# Patient Record
Sex: Female | Born: 1990 | Race: White | Hispanic: No | Marital: Married
Health system: Southern US, Community
[De-identification: ages and names within clinical notes are randomized; demographics above are authoritative.]

---

## 2011-12-15 ENCOUNTER — Emergency Department: Payer: Self-pay | Admitting: Emergency Medicine

## 2011-12-27 ENCOUNTER — Inpatient Hospital Stay: Payer: Self-pay | Admitting: Internal Medicine

## 2011-12-27 LAB — PREGNANCY, URINE: Pregnancy Test, Urine: NEGATIVE m[IU]/mL

## 2011-12-27 LAB — CBC WITH DIFFERENTIAL/PLATELET
Basophil %: 0.2 %
Eosinophil #: 0.2 10*3/uL (ref 0.0–0.7)
Eosinophil %: 2 %
HGB: 12.2 g/dL (ref 12.0–16.0)
Lymphocyte %: 17.7 %
MCHC: 32 g/dL (ref 32.0–36.0)
Neutrophil #: 7.3 10*3/uL — ABNORMAL HIGH (ref 1.4–6.5)
Neutrophil %: 71.8 %
Platelet: 354 10*3/uL (ref 150–440)
RBC: 4.69 10*6/uL (ref 3.80–5.20)
WBC: 10.1 10*3/uL (ref 3.6–11.0)

## 2011-12-27 LAB — URINALYSIS, COMPLETE
Blood: NEGATIVE
Glucose,UR: NEGATIVE mg/dL (ref 0–75)
Ketone: NEGATIVE
Leukocyte Esterase: NEGATIVE
Ph: 6 (ref 4.5–8.0)
Protein: NEGATIVE
RBC,UR: 1 /HPF (ref 0–5)
Specific Gravity: 1.017 (ref 1.003–1.030)
Squamous Epithelial: 23

## 2011-12-27 LAB — COMPREHENSIVE METABOLIC PANEL
Alkaline Phosphatase: 62 U/L — ABNORMAL LOW (ref 82–169)
Anion Gap: 13 (ref 7–16)
Bilirubin,Total: 0.2 mg/dL (ref 0.2–1.0)
Calcium, Total: 8.8 mg/dL — ABNORMAL LOW (ref 9.0–10.7)
Creatinine: 0.79 mg/dL (ref 0.60–1.30)
EGFR (African American): >60
EGFR (Non-African Amer.): >60
Glucose: 93 mg/dL (ref 65–99)
Osmolality: 289 (ref 275–301)
Potassium: 4.2 mmol/L (ref 3.5–5.1)
SGOT(AST): 29 U/L — ABNORMAL HIGH (ref 0–26)
Sodium: 145 mmol/L (ref 136–145)
Total Protein: 7.2 g/dL (ref 6.4–8.6)

## 2011-12-28 LAB — COMPREHENSIVE METABOLIC PANEL
Albumin: 2.9 g/dL — ABNORMAL LOW (ref 3.8–5.6)
Anion Gap: 7 (ref 7–16)
BUN: 10 mg/dL (ref 7–18)
Bilirubin,Total: 0.3 mg/dL (ref 0.2–1.0)
Chloride: 104 mmol/L (ref 98–107)
Creatinine: 0.66 mg/dL (ref 0.60–1.30)
EGFR (African American): >60
Osmolality: 282 (ref 275–301)
Potassium: 4 mmol/L (ref 3.5–5.1)
Total Protein: 6.8 g/dL (ref 6.4–8.6)

## 2011-12-28 LAB — CBC WITH DIFFERENTIAL/PLATELET
Basophil #: 0 10*3/uL (ref 0.0–0.1)
Basophil %: 0.3 %
Eosinophil #: 0.2 10*3/uL (ref 0.0–0.7)
Eosinophil %: 1.9 %
HCT: 37.9 % (ref 35.0–47.0)
HGB: 12 g/dL (ref 12.0–16.0)
Lymphocyte %: 19.7 %
MCHC: 31.8 g/dL — ABNORMAL LOW (ref 32.0–36.0)
MCV: 82 fL (ref 80–100)
Monocyte %: 7.2 %
Neutrophil #: 6.6 10*3/uL — ABNORMAL HIGH (ref 1.4–6.5)
Neutrophil %: 70.9 %
RBC: 4.63 10*6/uL (ref 3.80–5.20)
RDW: 16.8 % — ABNORMAL HIGH (ref 11.5–14.5)
WBC: 9.3 10*3/uL (ref 3.6–11.0)

## 2011-12-28 LAB — HEMOGLOBIN A1C: Hemoglobin A1C: 6.9 % — ABNORMAL HIGH (ref 4.2–6.3)

## 2011-12-28 LAB — LIPID PANEL
Cholesterol: 105 mg/dL (ref 0–200)
Ldl Cholesterol, Calc: 53 mg/dL (ref 0–100)

## 2011-12-28 LAB — VANCOMYCIN, TROUGH: Vancomycin, Trough: 10 ug/mL (ref 10–20)

## 2012-01-01 LAB — CULTURE, BLOOD (SINGLE)

## 2012-06-10 DEATH — deceased

## 2013-11-17 IMAGING — US US SOFT TISSUE EXCLUDE HEAD/NECK
1 series · 17 of 22 positions shown · non-contrast
Comparison: none

REASON FOR EXAM: abdominal wall cellulitis with suprapubic induration
concern for possible abscess
COMMENTS:

PROCEDURE:     US  - US SOFT TISSUE, NOT NECK /  HEAD  - December 27, 2011  [DATE]
RESULT:
TECHNIQUE: Real-time B-mode scan of the abdominal wall was obtained.

[Series 1: us soft tissue exclude head/neck · 17 of 22 slices shown]
[im 1/22]
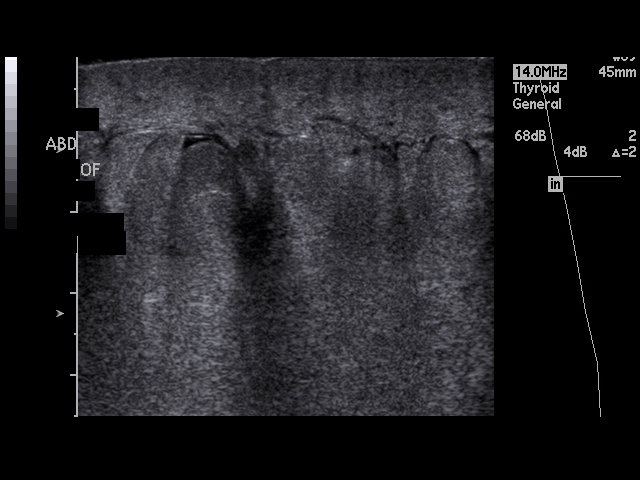
[im 2/22]
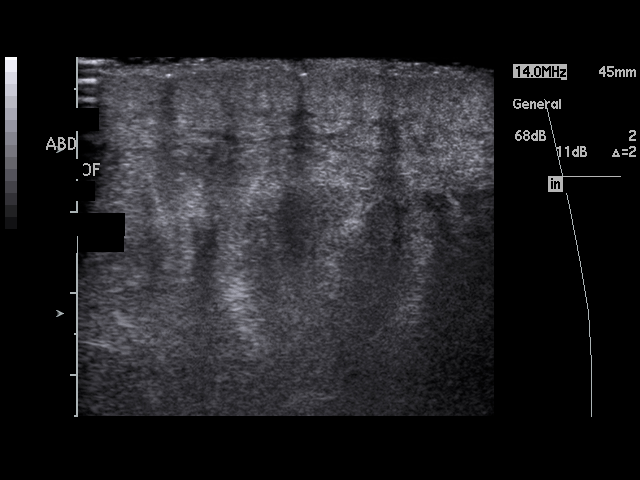
[im 4/22]
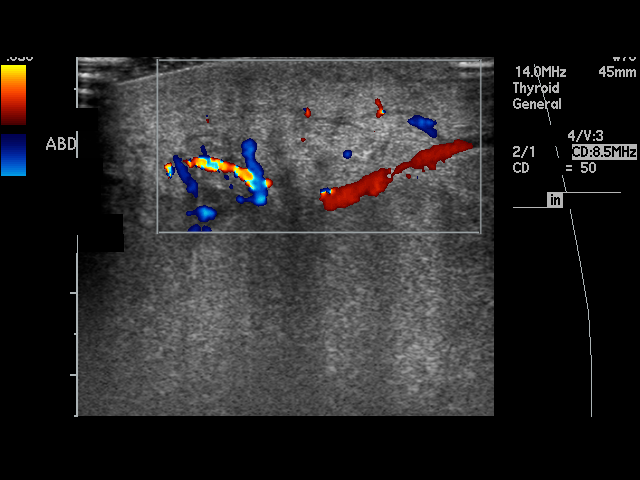
[im 5/22]
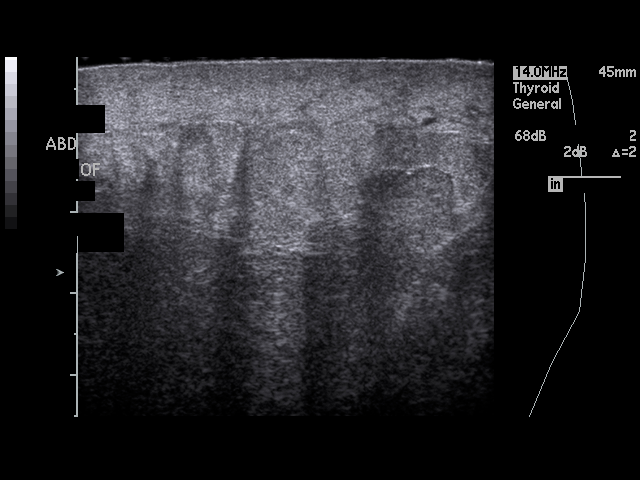
[im 6/22]
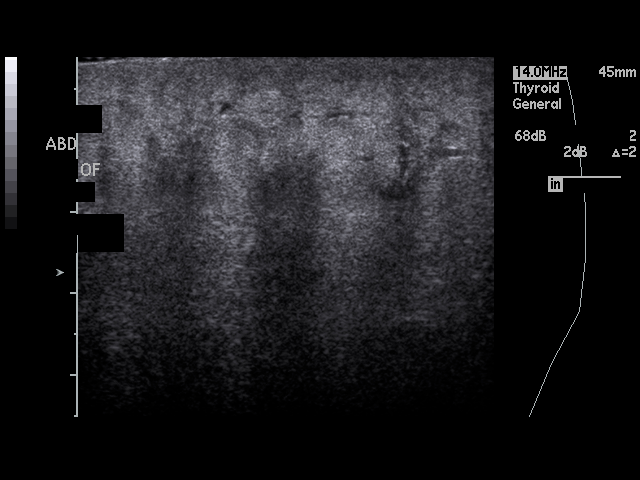
[im 8/22]
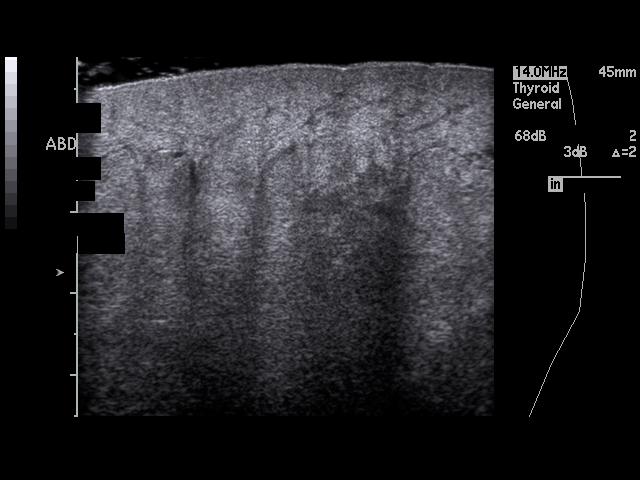
[im 9/22]
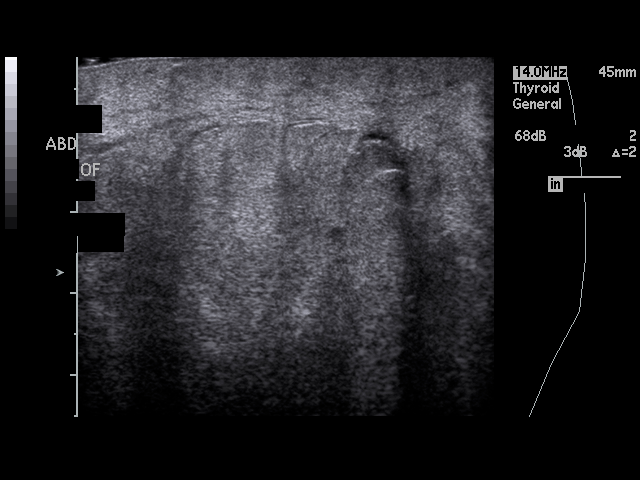
[im 10/22]
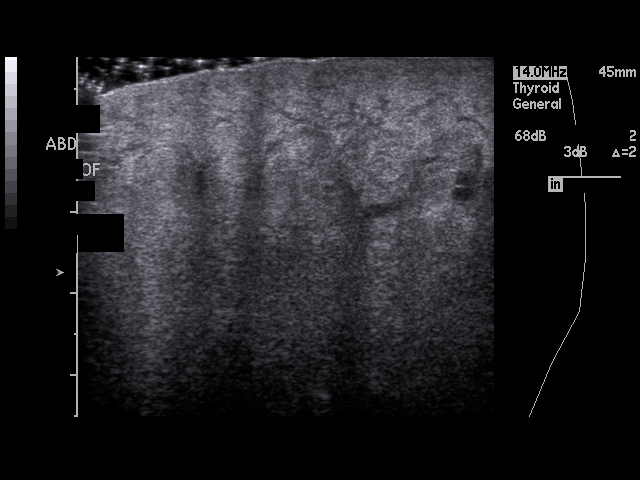
[im 12/22]
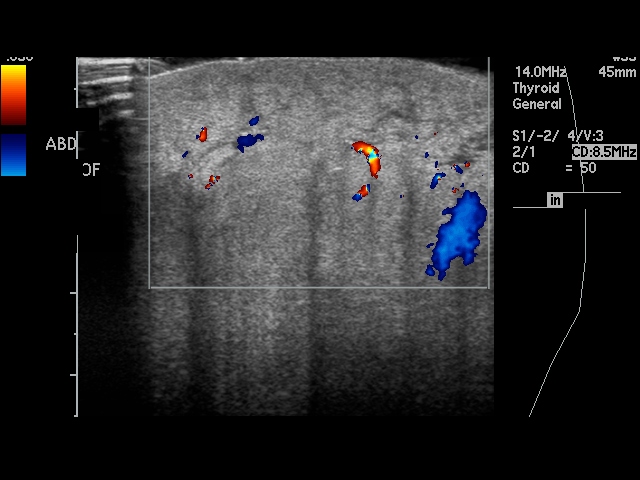
[im 13/22]
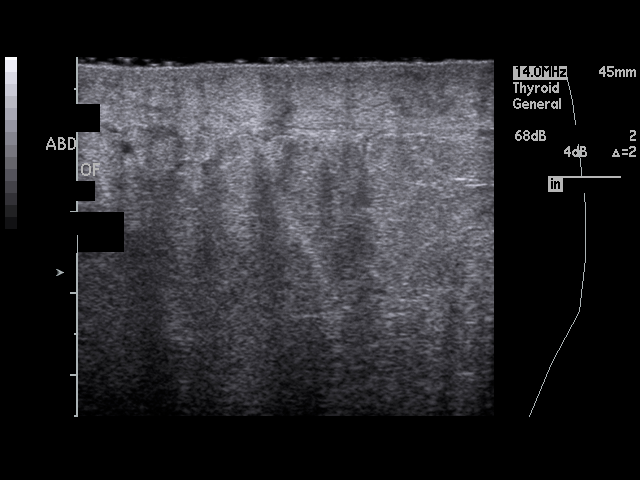
[im 14/22]
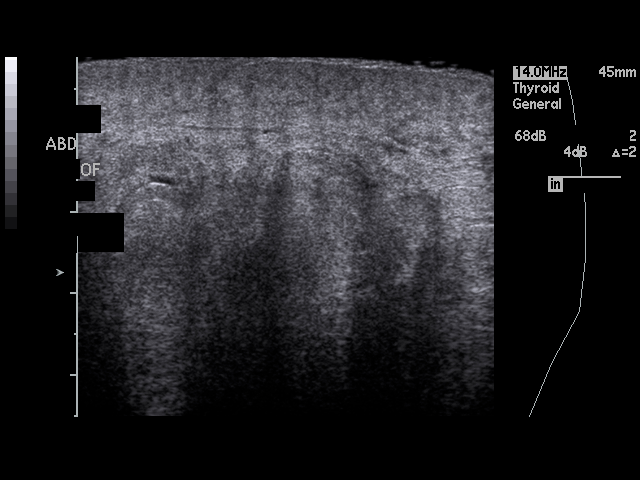
[im 15/22]
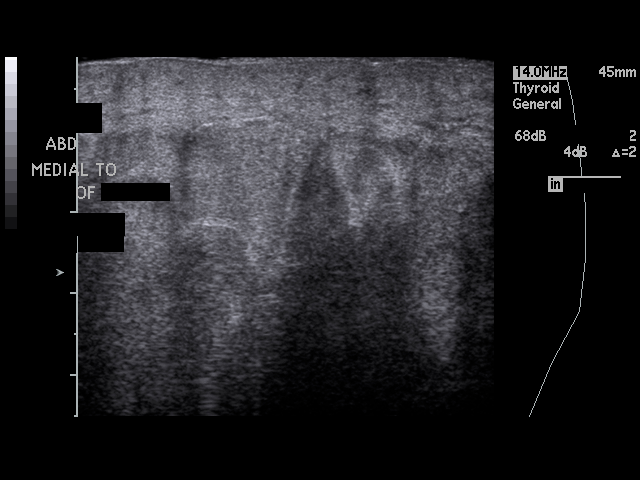
[im 17/22]
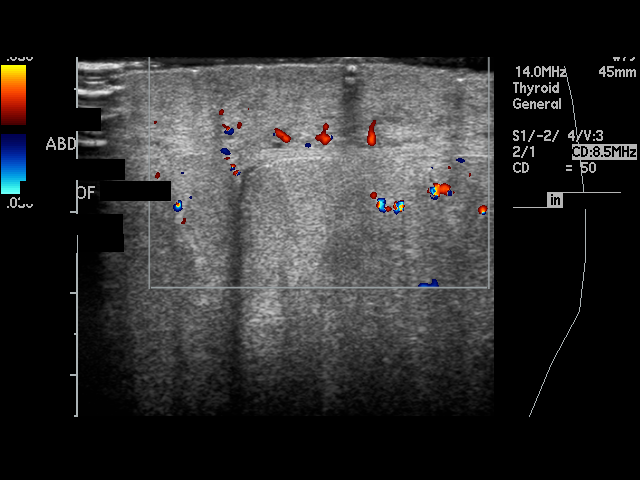
[im 18/22]
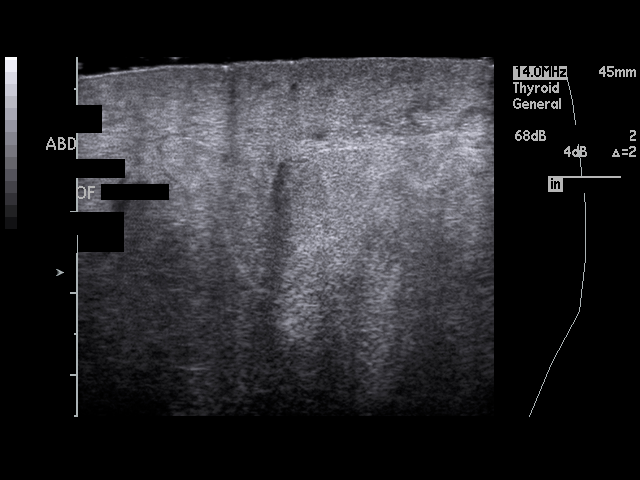
[im 19/22]
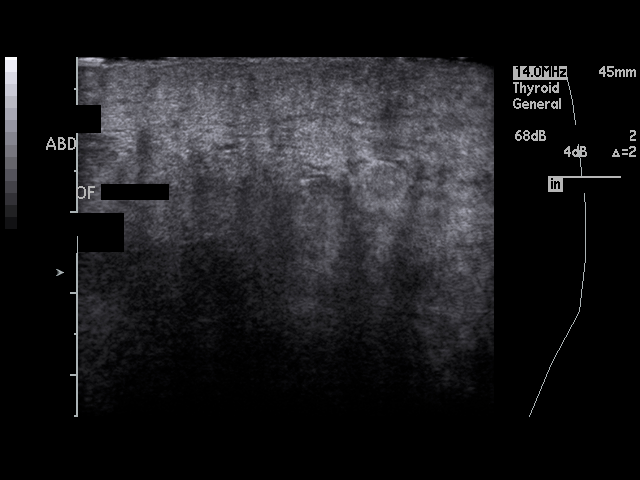
[im 21/22]
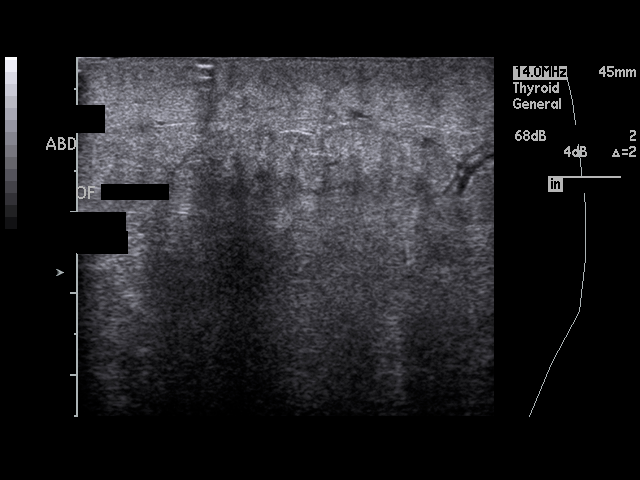
[im 22/22]
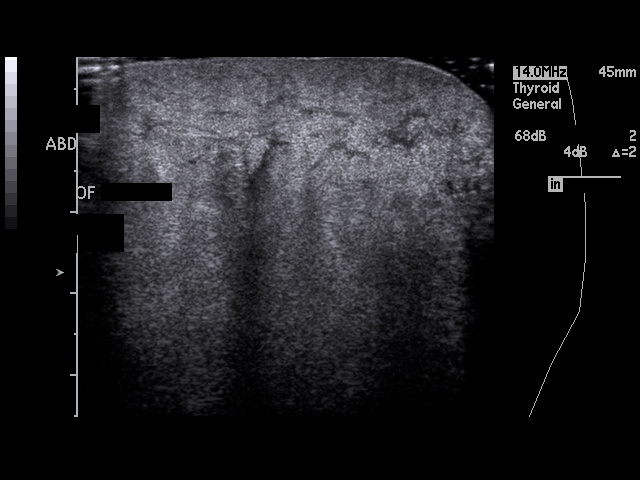

[17 of 22 positions shown; findings below may reference images not displayed]

FINDINGS: Evaluation of the soft tissues demonstrates extensive subcutaneous
edema consistent with cellulitis. No loculated fluid collections are
identified to suggest an abscess.
IMPRESSION: 1. Extensive subcutaneous edema consistent with cellulitis.
2. Dr. Bambucafe of the Emergency Department was informed of these findings via a
preliminary faxed report.

## 2015-04-04 NOTE — Discharge Summary (Signed)
PATIENT NAME:  Monica Lucas, Monica Lucas MR#:  [id number] DATE OF BIRTH:  [birth year]  DATE OF ADMISSION:  12/27/2011 DATE OF DISCHARGE:  12/28/2011  PRIMARY CARE PHYSICIAN: [facility]   REASON FOR ADMISSION: Redness and pain involving her anterior abdominal wall.   DISCHARGE DIAGNOSES: 1. Anterior abdominal wall cellulitis, clinically improved.  2. History of bipolar disorder.  3. History of PTSD.  4. History of asthma.  5. History of polycystic ovarian syndrome.  6. History of attention deficit hyperactivity disorder.  7. History of multiple personality disorder.  8. History of morbid obesity.  9. Borderline diabetes. Hemoglobin A1c 6.9.    CONSULTATIONS: None.   DISCHARGE MEDICATIONS:  1. Augmentin 875 mg 1 tab p.o. b.i.d. x5 days.  2. Percocet 5/325 1 tab p.o. q.8 hours p.r.n. pain.  3. Metformin 500 mg p.o. b.i.d.  4. Albuterol MDI 2 puffs inhaled q.4 hours p.r.n.   DISCHARGE DISPOSITION: Home.   DISCHARGE ACTIVITY: As tolerated.   DISCHARGE DIET: Low sodium, ADA, low fat, low cholesterol.    DISCHARGE INSTRUCTIONS:  1. Take medications as prescribed.  2. Return to the Emergency Department for recurrence of symptoms or for worsening of your abdominal wall swelling or redness, pain, development of fever or chills, or for any drainage noted from your abdominal wall.    FOLLOW-UP INSTRUCTIONS:  1. Follow-up with your primary care physician at [facility] in 1 to 2 weeks.  2. Follow-up with your psychiatrist in 1 to 2 weeks.   PROCEDURES: Soft tissue ultrasound of anterior abdominal wall 12/27/2011 there is extensive subcutaneous edema consistent with cellulitis. No loculated fluid collections are identified to suggest an abscess.   PERTINENT LABORATORY DATA: CMP normal on admission. CBC normal on admission. CBC normal on the day of discharge. Blood cultures x2 from 12/27/2011 with no growth to date. Urine pregnancy test is negative. and also to date. Hemoglobin  A1c 6.9.   BRIEF HISTORY AND HOSPITAL COURSE: The patient is a 24 year old morbidly obese female with past medical history of asthma, polycystic ovarian syndrome, multiple personality disorder, bipolar disorder, attention deficit hyperactivity disorder, depression, and PTSD who presented to the Emergency Department with complaints of anterior abdominal wall pain and redness. Please see dictated admission history and physical for pertinent details surrounding the onset of this hospitalization. Please see below for further details.  1. Anterior abdominal wall cellulitis. Exact cause was unclear and the patient denied any recent trauma. She presented in a nontoxic-appearing state and was afebrile and without leukocytosis. Soft tissue ultrasound was obtained of the anterior abdominal wall revealing extensive edema consistent with anterior abdominal wall cellulitis but no abscesses were noted. Blood cultures were obtained and the patient was empirically started on IV antibiotics to which she showed excellent response. Her erythema and pain have significantly improved to the point where erythema has almost completely resolved. Her pain has significantly improved. She remained hemodynamically stable during this hospitalization. Blood cultures did not reveal any growth to date. Once her condition improved, she was transitioned off IV antibiotics to p.o. antibiotics which she will continue as an outpatient. She was recommended a total of 7 day course of antibiotics and has completed two days of inpatient antibiotic therapy with five additional days of therapy remaining to be taken as an outpatient and will need close outpatient follow-up with her primary physician.  2. Bipolar disorder. This remained stable. She was without any depression, anxiety, or mania and she denied any suicidal or homicidal ideation. She was not  on any medications for this condition at baseline at home. She can continue to follow-up with her  psychiatrist upon hospital discharge to which the patient was agreeable.  3. Asthma. This was stable without acute exacerbation. The patient is to continue her albuterol MDI p.r.n.  4. Morbid obesity. The patient was counseled on the importance of weight loss and advised to eat a healthy diet as well as exercise as often as possible.  5. Attention deficit hyperactivity disorder and multiple personality disorder. The patient is to continue to follow-up with her psychiatrist as an outpatient. 6. History of polycystic ovarian syndrome. The patient is to continue metformin and follow-up as an outpatient with her primary care physician who can refer her to a gynecologist and this will be deferred to her primary care physician to which the patient was agreeable. 7. Borderline diabetes. On admission the patient's serum glucose was 93 and fasting serum glucose was 126 on the day of discharge. Hemoglobin A1c returned at 6.9 consistent with borderline diabetes mellitus. She is already taking metformin given her history of PCOS for impaired glucose intolerance. She was advised to continue taking metformin for now and was advised to adhere to an ADA diet and will need close outpatient follow-up in regards to further management of her borderline diabetes to which the patient was agreeable.   On 12/28/2011, the patient was hemodynamically stable with significant improvement of her anterior abdominal wall erythema and she was without any abdominal pain when I rounded on her and she was felt to be stable for discharge home with close outpatient follow-up to which the patient was agreeable.   TIME SPENT ON DISCHARGE: Greater than 30 minutes.   ____________________________ Elon AlasKamran N. Lilley Hubble, MD [id number] D: 12/31/2011 23:16:28 ET T: 01/01/2012 13:48:39 ET JOB#: [id number]  cc: Elon AlasKamran N. Delonta Yohannes, MD, <Dictator> [facility] Scotty CourtKAMRAN N Diavion Labrador MD ELECTRONICALLY SIGNED 01/09/2012 18:52

## 2015-04-04 NOTE — H&P (Signed)
PATIENT NAME:  Monica Lucas, MELNIK MR#:  [id number] DATE OF BIRTH:  1991-08-20  DATE OF ADMISSION:  12/27/2011  PRIMARY CARE PHYSICIAN: The patient is unassigned to the hospitalist service.  EMERGENCY ROOM PHYSICIAN: Dr. Manson Passey   CHIEF COMPLAINT: Abdominal wall cellulitis.   HISTORY OF PRESENT ILLNESS: The patient is a 24 year old female who presents with chief complaint of redness, pain over the abdominal wall. Symptoms began five days ago. The redness has been getting worse. She has developed abdominal wall pain from this. The patient denies fevers, chills, and nausea. The patient has been having some headaches. She has a history of sleep apnea. She is apparently not on CPAP.   PAST MEDICAL HISTORY:  1. Multiple personality disorder.  2. Bipolar disorder.  3. Attention deficit hypersensitivity disorder.  4. Depression. 5. Posttraumatic stress syndrome.  6. Asthma.  7. Polycystic ovary syndrome.   ALLERGIES: Adderall, Suboxone, Wellbutrin, Zoloft.   CURRENT MEDICATIONS:  1. Metformin 500 mg one p.o. b.i.d.  2. Albuterol 2 puffs every four hours.   SOCIAL HISTORY: The patient is married. She denies tobacco abuse, alcohol abuse, or drug abuse. She is disabled.   FAMILY HISTORY: Positive for diabetes in both parents.    REVIEW OF SYSTEMS: CONSTITUTIONAL: The patient denied any fevers, chills, or night sweats. HEENT: The patient denies any hearing loss, dysphagia, visual problems, or sore throat.  CARDIOVASCULAR: The patient denies any chest pain, orthopnea, or PND. RESPIRATORY: The patient denied any cough, wheezing, or hemoptysis. GI: The patient denies any nausea, vomiting, hematemesis, hematochezia, or melena. GU: The patient denies any hematuria, dysuria, or frequency. NEUROLOGIC: The patient denies any headache, focal weakness or seizures. SKIN: The patient denies any lesions or rash. ENDOCRINE: The patient denies polyuria, polyphagia, or polydipsia. MUSCULOSKELETAL: The patient denies  any arthritis, joint effusion, arthralgias, or myalgias. HEMATOLOGICAL: The patient denies any easy bleeding or bruises.   PHYSICAL EXAMINATION:  VITAL SIGNS: Temperature 98, heart rate 115, respirations 22, blood pressure 174/86, oxygen saturation 93%.   HEENT: Atraumatic, normocephalic. Pupils equal, round, and reactive to light. Extraocular movements intact. Sclerae anicteric. Mucous membranes dry.   NECK: Supple. No organomegaly.   CARDIOVASCULAR: S1, S2, regular rate, rhythm. No gallops. No thrills. No murmurs.   LUNGS: Clear to auscultation. No rales, no rhonchi, no wheezes, no bronchial breath sounds.   GI: Abdomen is soft, nontender. There is extensive cellulitis over the abdominal wall present.   GU: There is no hematuria or masses.   SKIN: There are no lesions.   NEUROLOGIC: Cranial nerves II through XII grossly intact. Motor strength is five out of five bilateral upper and lower extremities. Sensation is within normal limits. No focal neurological deficits noted on examination.   MUSCULOSKELETAL: No arthritis, joint effusion, or swelling.   HEMATOLOGICAL: No ecchymosis, no bleeding, and no petechiae noted.   EXTREMITIES: No cyanosis, no clubbing, no edema. 2+ pedal pulses bilaterally.   LABORATORY DATA:  Urinalysis is negative. WBC count 10,100, hemoglobin 12.2, hematocrit 38, platelet count is 354. Glucose 93, BUN is 14, creatinine 0.79, sodium 145, potassium 4.2, chloride 102, CO2 30, total calcium 8.8, total bilirubin 0.2. Alkaline phosphatase 62, ALT 56, AST 29, total protein 7.2, albumin 3.1. Estimated GFR greater than 60.   ASSESSMENT AND PLAN:   1. The patient is a 24 year old female who presents with chief complaint of abdominal wall pain and redness consistent with extensive abdominal wall cellulitis. Admit to the medical floor. Start Levaquin and vancomycin, follow blood cultures. 2. Deep  vein thrombosis prophylaxis.  3. Morbid obesity. Lifestyle modifications,  weight loss, diet.   4. Hypoalbuminemia. Dietary consultation.    ____________________________ Donia AstJignesh S. Maida Widger, MD [id number] D: 12/27/2011 03:59:41 ET T: 12/27/2011 05:56:05 ET JOB#: [id number]  cc: Donia AstJignesh S. Jeaneane Adamec, MD, <Dictator> Donia AstJIGNESH S Ardis Lawley MD ELECTRONICALLY SIGNED 12/27/2011 21:56
